# Patient Record
Sex: Male | Born: 1995 | Race: Black or African American | Hispanic: No | Marital: Single | Smoking: Current every day smoker
Health system: Southern US, Community
[De-identification: ages and names within clinical notes are randomized; demographics above are authoritative.]

## PROBLEM LIST (undated history)

## (undated) ENCOUNTER — Emergency Department (HOSPITAL_COMMUNITY): Payer: No Typology Code available for payment source | Source: Home / Self Care

## (undated) DIAGNOSIS — W3400XA Accidental discharge from unspecified firearms or gun, initial encounter: Secondary | ICD-10-CM

## (undated) DIAGNOSIS — J45909 Unspecified asthma, uncomplicated: Secondary | ICD-10-CM

## (undated) HISTORY — PX: EYE SURGERY: SHX253

---
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
## 2019-03-17 ENCOUNTER — Emergency Department (HOSPITAL_COMMUNITY)
Admission: EM | Admit: 2019-03-17 | Discharge: 2019-03-17 | Disposition: A | Discharge status: Home / Self Care | Payer: No Typology Code available for payment source | Attending: Emergency Medicine | Admitting: Emergency Medicine

## 2019-03-17 ENCOUNTER — Emergency Department (HOSPITAL_COMMUNITY): Payer: No Typology Code available for payment source

## 2019-03-17 DIAGNOSIS — Y999 Unspecified external cause status: Secondary | ICD-10-CM | POA: Insufficient documentation

## 2019-03-17 DIAGNOSIS — Z113 Encounter for screening for infections with a predominantly sexual mode of transmission: Secondary | ICD-10-CM | POA: Insufficient documentation

## 2019-03-17 DIAGNOSIS — Y9389 Activity, other specified: Secondary | ICD-10-CM | POA: Diagnosis not present

## 2019-03-17 DIAGNOSIS — F17210 Nicotine dependence, cigarettes, uncomplicated: Secondary | ICD-10-CM | POA: Insufficient documentation

## 2019-03-17 DIAGNOSIS — Y92410 Unspecified street and highway as the place of occurrence of the external cause: Secondary | ICD-10-CM | POA: Insufficient documentation

## 2019-03-17 DIAGNOSIS — S139XXA Sprain of joints and ligaments of unspecified parts of neck, initial encounter: Secondary | ICD-10-CM | POA: Insufficient documentation

## 2019-03-17 DIAGNOSIS — G44319 Acute post-traumatic headache, not intractable: Secondary | ICD-10-CM | POA: Insufficient documentation

## 2019-03-17 DIAGNOSIS — Z79899 Other long term (current) drug therapy: Secondary | ICD-10-CM | POA: Diagnosis not present

## 2019-03-17 DIAGNOSIS — M25531 Pain in right wrist: Secondary | ICD-10-CM

## 2019-03-17 MED ORDER — NAPROXEN 500 MG PO TABS: 500.0000 mg | ORAL_TABLET | Freq: Two times a day (BID) | ORAL | 0 refills | Status: DC

## 2019-03-17 MED ORDER — CEFTRIAXONE SODIUM 500 MG IJ SOLR
500.0000 mg | Freq: Once | INTRAMUSCULAR | Status: AC
  Administered 2019-03-17: 16:00:00 500 mg via INTRAMUSCULAR
  Filled 2019-03-17: qty 500

## 2019-03-17 MED ORDER — STERILE WATER FOR INJECTION IJ SOLN
INTRAMUSCULAR | Status: AC
  Administered 2019-03-17: 16:00:00 10 mL
  Filled 2019-03-17: qty 10

## 2019-03-17 MED ORDER — DOXYCYCLINE HYCLATE 100 MG PO CAPS: 100.0000 mg | ORAL_CAPSULE | Freq: Two times a day (BID) | ORAL | 0 refills | Status: AC

## 2019-03-17 MED ORDER — METHOCARBAMOL 500 MG PO TABS: 500.0000 mg | ORAL_TABLET | Freq: Two times a day (BID) | ORAL | 0 refills | Status: AC

## 2019-03-17 MED ORDER — DOXYCYCLINE HYCLATE 100 MG PO TABS
100.0000 mg | ORAL_TABLET | Freq: Once | ORAL | Status: AC
  Administered 2019-03-17: 16:00:00 100 mg via ORAL
  Filled 2019-03-17: qty 1
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
## 2019-03-19 LAB — GC/CHLAMYDIA PROBE AMP (~~LOC~~) NOT AT ARMC
Chlamydia: POSITIVE — AB
Neisseria Gonorrhea: NEGATIVE

## 2021-06-30 ENCOUNTER — Ambulatory Visit
Admission: EM | Admit: 2021-06-30 | Discharge: 2021-06-30 | Disposition: A | Discharge status: Home / Self Care | Payer: Medicaid Other | Attending: Internal Medicine | Admitting: Internal Medicine

## 2021-06-30 ENCOUNTER — Encounter: Payer: Self-pay | Admitting: Emergency Medicine

## 2021-06-30 ENCOUNTER — Other Ambulatory Visit: Payer: Self-pay

## 2021-06-30 DIAGNOSIS — R053 Chronic cough: Secondary | ICD-10-CM | POA: Diagnosis present

## 2021-06-30 DIAGNOSIS — Z113 Encounter for screening for infections with a predominantly sexual mode of transmission: Secondary | ICD-10-CM | POA: Insufficient documentation

## 2021-06-30 MED ORDER — BENZONATATE 100 MG PO CAPS: 100.0000 mg | ORAL_CAPSULE | Freq: Three times a day (TID) | ORAL | 0 refills | Status: AC | PRN

## 2021-06-30 MED ORDER — PREDNISONE 20 MG PO TABS: 40.0000 mg | ORAL_TABLET | Freq: Every day | ORAL | 0 refills | Status: AC

## 2021-06-30 MED ORDER — AZITHROMYCIN 250 MG PO TABS: ORAL_TABLET | ORAL | 0 refills | Status: AC

## 2021-06-30 NOTE — Discharge Instructions (Signed)
I am suspicious that you may have bronchitis.  This is being treated with antibiotics and prednisone steroid medication.  Please follow-up if symptoms persist or worsen.  Your STD test is pending.  We will call if anything is positive and treat as appropriate.  Please refrain from sexual activity until test results and treatment are complete.

## 2021-06-30 NOTE — ED Triage Notes (Signed)
STI-denies symptoms, just wants to be checked  Cough started 2-3 weeks ago.  Throat congestion, cough, denies fever.
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
## 2021-07-01 LAB — CYTOLOGY, (ORAL, ANAL, URETHRAL) ANCILLARY ONLY
Comment: NEGATIVE
Comment: NEGATIVE
Neisseria Gonorrhea: POSITIVE — AB
Trichomonas: POSITIVE — AB

## 2021-07-02 ENCOUNTER — Telehealth (HOSPITAL_COMMUNITY): Payer: Self-pay | Admitting: Emergency Medicine

## 2021-07-02 ENCOUNTER — Ambulatory Visit
Admission: EM | Admit: 2021-07-02 | Discharge: 2021-07-02 | Disposition: A | Discharge status: Home / Self Care | Payer: Medicaid Other | Attending: Urgent Care | Admitting: Urgent Care

## 2021-07-02 DIAGNOSIS — A549 Gonococcal infection, unspecified: Secondary | ICD-10-CM | POA: Diagnosis not present

## 2021-07-02 MED ORDER — CEFTRIAXONE SODIUM 500 MG IJ SOLR
500.0000 mg | Freq: Once | INTRAMUSCULAR | Status: AC
  Administered 2021-07-02: 500 mg via INTRAMUSCULAR

## 2021-07-02 MED ORDER — METRONIDAZOLE 500 MG PO TABS: 2000.0000 mg | ORAL_TABLET | Freq: Once | ORAL | 0 refills | Status: AC

## 2021-07-02 NOTE — Telephone Encounter (Signed)
Per protocol, patient will need treatment with IM Rocephin 500mg  for positive Gonorrhea.  Will also need treatment with Flagyl.   Contacted patient by phone.  Verified identity using two identifiers.  Provided positive result.  Reviewed safe sex practices, notifying partners, and refraining from sexual activities for 7 days from time of treatment.  Patient verified understanding, all questions answered.   HHS notified Verified pharmacy, prescription sent

## 2021-07-02 NOTE — ED Triage Notes (Signed)
Pt presents for sti treatment follow up \
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
## 2022-05-06 ENCOUNTER — Encounter (HOSPITAL_COMMUNITY): Payer: Self-pay

## 2022-05-06 ENCOUNTER — Emergency Department (HOSPITAL_COMMUNITY)
Admission: EM | Admit: 2022-05-06 | Discharge: 2022-05-06 | Disposition: A | Discharge status: Home / Self Care | Payer: No Typology Code available for payment source | Attending: Emergency Medicine | Admitting: Emergency Medicine

## 2022-05-06 ENCOUNTER — Other Ambulatory Visit: Payer: Self-pay

## 2022-05-06 ENCOUNTER — Emergency Department (HOSPITAL_COMMUNITY): Payer: No Typology Code available for payment source

## 2022-05-06 DIAGNOSIS — X509XXA Other and unspecified overexertion or strenuous movements or postures, initial encounter: Secondary | ICD-10-CM | POA: Diagnosis not present

## 2022-05-06 DIAGNOSIS — S93491A Sprain of other ligament of right ankle, initial encounter: Secondary | ICD-10-CM | POA: Insufficient documentation

## 2022-05-06 DIAGNOSIS — Y9302 Activity, running: Secondary | ICD-10-CM | POA: Insufficient documentation

## 2022-05-06 DIAGNOSIS — S99911A Unspecified injury of right ankle, initial encounter: Secondary | ICD-10-CM | POA: Diagnosis present

## 2022-05-06 DIAGNOSIS — S93401A Sprain of unspecified ligament of right ankle, initial encounter: Secondary | ICD-10-CM

## 2022-05-06 MED ORDER — IBUPROFEN 400 MG PO TABS
600.0000 mg | ORAL_TABLET | Freq: Once | ORAL | Status: AC
  Administered 2022-05-06: 600 mg via ORAL
  Filled 2022-05-06: qty 1

## 2022-05-06 NOTE — Discharge Instructions (Addendum)
It was a pleasure taking care of you!   Your x-ray was negative for fracture or dislocation.  You may take over the counter 600 mg Ibuprofen every 6 hours and alternate with 500 mg Tylenol every 6 hours as needed for pain for no more than 7 days.  You will be given Ace wrap today, you may wear it as needed throughout the day, you may remove it at night.  It is important that you elevate your ankle.  You may apply ice to affected area for up to 15 minutes at a time. Ensure to place a barrier between your skin and the ice.  Attached is information for the sports medicine doctor to follow-up as needed if your symptoms persist.  You may follow-up with your primary care provider as needed.  Return to the Emergency Department if you are experiencing increasing/worsening symptoms.

## 2022-05-06 NOTE — ED Provider Notes (Signed)
[place] EMERGENCY DEPARTMENT AT [facility] Provider Note   CSN: [id number] Arrival date & time: 05/06/22  1155     History  Chief Complaint  Patient presents with   Ankle Pain    Zachary Ruiz is a 27 y.o. male who presents to the ED with concerns for right ankle injury onset last night.  He notes when he was running last night he felt a little bit of pain to the ankle, notes that his pain was exacerbated this morning when he woke up.  No meds tried prior to arrival.  Denies swelling, redness, knee pain, lower extremity pain.  The history is provided by the patient. No language interpreter was used.       Home Medications Prior to Admission medications   Medication Sig Start Date End Date Taking? Authorizing Provider  benzonatate (TESSALON) 100 MG capsule Take 1 capsule (100 mg total) by mouth every 8 (eight) hours as needed for cough. 06/30/21   Gustavus Bryant, FNP  doxycycline (VIBRAMYCIN) 100 MG capsule Take 1 capsule (100 mg total) by mouth 2 (two) times daily. Patient not taking: Reported on 06/30/2021 03/17/19   Henderly, Britni A, PA-C  methocarbamol (ROBAXIN) 500 MG tablet Take 1 tablet (500 mg total) by mouth 2 (two) times daily. Patient not taking: Reported on 06/30/2021 03/17/19   Henderly, Britni A, PA-C  naproxen (NAPROSYN) 500 MG tablet Take 1 tablet (500 mg total) by mouth 2 (two) times daily. Patient not taking: Reported on 06/30/2021 03/17/19   Henderly, Britni A, PA-C  oxyCODONE (OXY IR/ROXICODONE) 5 MG immediate release tablet Take 1-2 tablets (5-10 mg total) by mouth every 4 (four) hours as needed for moderate pain. Patient not taking: Reported on 06/02/2016 02/09/16   Adam Phenix, PA-C  promethazine (PHENERGAN) 25 MG tablet Take 1 tablet (25 mg total) by mouth every 6 (six) hours as needed for nausea or vomiting. Patient not taking: Reported on 06/30/2021 06/02/16   Charlestine Night, PA-C      Allergies    Patient has no known allergies.     Review of Systems   Review of Systems  All other systems reviewed and are negative.   Physical Exam Updated Vital Signs BP 136/88 (BP Location: Right Arm)   Pulse (!) 104   Temp 98.4 F (36.9 C) (Oral)   Resp 20   SpO2 99%  Physical Exam Vitals and nursing note reviewed.  Constitutional:      General: He is not in acute distress.    Appearance: Normal appearance.  Eyes:     General: No scleral icterus.    Extraocular Movements: Extraocular movements intact.  Cardiovascular:     Rate and Rhythm: Normal rate.  Pulmonary:     Effort: Pulmonary effort is normal. No respiratory distress.  Musculoskeletal:     Cervical back: Neck supple.     Comments: Mild tenderness to palpation to right lateral malleolus. No obvious deformity, effusion, erythema, or swelling.  Full active range of motion of right ankle against resistance.  Pedal pulse intact.  No tenderness to palpation noted to right lower extremity, right knee.  Able to flex and extend right knee against resistance without difficulty.  Skin:    General: Skin is warm and dry.     Findings: No bruising, erythema or rash.  Neurological:     Mental Status: He is alert.  Psychiatric:        Behavior: Behavior normal.     ED Results /  Procedures / Treatments   Labs (all labs ordered are listed, but only abnormal results are displayed) Labs Reviewed - No data to display  EKG None  Radiology DG Ankle Complete Right  Result Date: 05/06/2022 CLINICAL DATA:  Acute right ankle pain. EXAM: RIGHT ANKLE - COMPLETE 3+ VIEW COMPARISON:  None Available. FINDINGS: There is no evidence of fracture, dislocation, or joint effusion. There is no evidence of arthropathy or other focal bone abnormality. Soft tissues are unremarkable. IMPRESSION: Negative. Electronically Signed   By: Lupita Raider M.D.   On: 05/06/2022 12:26    Procedures Procedures  Medications Ordered in ED Medications  ibuprofen (ADVIL) tablet 600 mg (600 mg Oral  Given 05/06/22 1328)    ED Course/ Medical Decision Making/ A&P Clinical Course as of 05/06/22 1341  Fri May 06, 2022  1237 Discussed with patient imaging findings. Discussed discharge treatment plan. Pt agreeable at this time. Pt appears safe for discharge. [SB]    Clinical Course User Index [SB] Rafiel Mecca A, PA-C                             Medical Decision Making Amount and/or Complexity of Data Reviewed Radiology: ordered.   Patient with right ankle pain onset last night. Vital signs patient afebrile. On exam, patient with Mild tenderness to palpation to right lateral malleolus. No obvious deformity, effusion, erythema, or swelling.  Full active range of motion of right ankle against resistance.  Pedal pulse intact.  Differential diagnosis includes sprain, fracture, dislocation.    Imaging: I ordered imaging studies including right ankle x-ray I independently visualized and interpreted imaging which showed: no acute findings I agree with the radiologist interpretation  Medications:  I ordered medication including ice for symptom management  I have reviewed the patients home medicines and have made adjustments as needed   Disposition: Presenting suspicious for sprain of right ankle.  Doubt concerns at this time for fracture or dislocation.  After consideration of the diagnostic results and the patients response to treatment, I feel that the patient would benefit from Discharge home.  Patient provided with Ace wrap and crutches today.  Offered work note, patient declines at this time.  Information for sports medicine doctor provided to patient today. Supportive care measures and strict return precautions discussed with patient at bedside. Pt acknowledges and verbalizes understanding. Pt appears safe for discharge. Follow up as indicated in discharge paperwork.    This chart was dictated using voice recognition software, Dragon. Despite the best efforts of this provider to  proofread and correct errors, errors may still occur which can change documentation meaning.  Final Clinical Impression(s) / ED Diagnoses Final diagnoses:  Sprain of right ankle, unspecified ligament, initial encounter    Rx / DC Orders ED Discharge Orders     None         Saphira Lahmann A, PA-C 05/06/22 1341    Alvira Monday, MD 05/06/22 2259

## 2022-05-06 NOTE — ED Triage Notes (Signed)
Pt came in via POV d/t Rt ankle pain that started this morning, pt does not know what happened what caused this.

## 2022-05-06 NOTE — ED Notes (Signed)
Patient transported to X-ray 

## 2022-05-06 NOTE — Progress Notes (Signed)
Orthopedic Tech Progress Note Patient Details:  Zachary Ruiz [birth year] [id number]  Ortho Devices Type of Ortho Device: Crutches, Ace wrap Ortho Device/Splint Location: RLE Ortho Device/Splint Interventions: Ordered, Application, Adjustment   Post Interventions Patient Tolerated: Well, Ambulated well Instructions Provided: Poper ambulation with device, Care of device, Adjustment of device  [place] A [name] 05/06/2022, 1:41 PM

## 2023-04-22 ENCOUNTER — Encounter (HOSPITAL_COMMUNITY): Payer: Self-pay | Admitting: *Deleted

## 2023-04-22 ENCOUNTER — Other Ambulatory Visit: Payer: Self-pay

## 2023-04-22 ENCOUNTER — Ambulatory Visit (HOSPITAL_COMMUNITY)
Admission: EM | Admit: 2023-04-22 | Discharge: 2023-04-22 | Disposition: A | Discharge status: Home / Self Care | Attending: Internal Medicine | Admitting: Internal Medicine

## 2023-04-22 DIAGNOSIS — R112 Nausea with vomiting, unspecified: Secondary | ICD-10-CM

## 2023-04-22 DIAGNOSIS — G4489 Other headache syndrome: Secondary | ICD-10-CM

## 2023-04-22 MED ORDER — KETOROLAC TROMETHAMINE 30 MG/ML IJ SOLN
30.0000 mg | Freq: Once | INTRAMUSCULAR | Status: AC
  Administered 2023-04-22: 30 mg via INTRAMUSCULAR

## 2023-04-22 MED ORDER — KETOROLAC TROMETHAMINE 10 MG PO TABS: 10.0000 mg | ORAL_TABLET | Freq: Four times a day (QID) | ORAL | 0 refills | Status: AC | PRN

## 2023-04-22 MED ORDER — DEXAMETHASONE SODIUM PHOSPHATE 10 MG/ML IJ SOLN
10.0000 mg | Freq: Once | INTRAMUSCULAR | Status: AC
  Administered 2023-04-22: 10 mg via INTRAMUSCULAR

## 2023-04-22 MED ORDER — KETOROLAC TROMETHAMINE 30 MG/ML IJ SOLN
INTRAMUSCULAR | Status: AC
  Filled 2023-04-22: qty 1

## 2023-04-22 MED ORDER — DEXAMETHASONE SODIUM PHOSPHATE 10 MG/ML IJ SOLN
INTRAMUSCULAR | Status: AC
  Filled 2023-04-22: qty 1
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
## 2023-04-22 NOTE — Discharge Instructions (Addendum)
 Headache with nausea and vomiting.  We have treated for the headache today as there was no nausea or vomiting since this morning.  As the symptoms seem to already be improving, we will discharge home.  We have treated today as follows: Toradol injection given today. This is a medication to help with pain. This is not a narcotic.  Decadron injection given today. This is a steroid to help with inflammation and pain. Toradol 10 mg every 6 hours as needed for pain. Can take this medication at 9:15 pm tonight. Do not take ibuprofen or naproxen/aleve while on this medication. Make sure to stay hydrated as dehydration can exacerbate headaches. Return to the emergency room if symptoms worsen.

## 2023-04-22 NOTE — ED Triage Notes (Signed)
 PT reports he has had a HA since yesterday and has been vomiting . Pt vomited 3 times this AM.
